# Patient Record
Sex: Male | Born: 1970 | Race: White | Hispanic: No | Marital: Married | Smoking: Former smoker
Health system: Southern US, Community
[De-identification: ages and names within clinical notes are randomized; demographics above are authoritative.]

---

## 2003-11-14 ENCOUNTER — Emergency Department (HOSPITAL_COMMUNITY): Admission: EM | Admit: 2003-11-14 | Discharge: 2003-11-14 | Payer: Self-pay | Admitting: Emergency Medicine

## 2005-01-22 ENCOUNTER — Emergency Department: Payer: Self-pay | Admitting: Emergency Medicine

## 2005-02-23 ENCOUNTER — Emergency Department: Payer: Self-pay | Admitting: Emergency Medicine

## 2006-03-01 ENCOUNTER — Emergency Department: Payer: Self-pay | Admitting: Internal Medicine

## 2013-05-31 ENCOUNTER — Ambulatory Visit: Payer: Self-pay | Admitting: Family Medicine

## 2015-09-22 ENCOUNTER — Encounter: Payer: Self-pay | Admitting: *Deleted

## 2015-09-22 ENCOUNTER — Emergency Department
Admission: EM | Admit: 2015-09-22 | Discharge: 2015-09-22 | Disposition: A | Discharge status: Home / Self Care | Payer: Self-pay | Attending: Emergency Medicine | Admitting: Emergency Medicine

## 2015-09-22 DIAGNOSIS — Y999 Unspecified external cause status: Secondary | ICD-10-CM | POA: Insufficient documentation

## 2015-09-22 DIAGNOSIS — W268XXA Contact with other sharp object(s), not elsewhere classified, initial encounter: Secondary | ICD-10-CM | POA: Insufficient documentation

## 2015-09-22 DIAGNOSIS — S61214A Laceration without foreign body of right ring finger without damage to nail, initial encounter: Secondary | ICD-10-CM | POA: Insufficient documentation

## 2015-09-22 DIAGNOSIS — Z23 Encounter for immunization: Secondary | ICD-10-CM | POA: Insufficient documentation

## 2015-09-22 DIAGNOSIS — Y929 Unspecified place or not applicable: Secondary | ICD-10-CM | POA: Insufficient documentation

## 2015-09-22 DIAGNOSIS — Y9389 Activity, other specified: Secondary | ICD-10-CM | POA: Insufficient documentation

## 2015-09-22 MED ORDER — CEPHALEXIN 500 MG PO CAPS
500.0000 mg | ORAL_CAPSULE | Freq: Three times a day (TID) | ORAL | 0 refills | Status: DC
Start: 2015-09-22 — End: 2021-09-26

## 2015-09-22 MED ORDER — TETANUS-DIPHTH-ACELL PERTUSSIS 5-2.5-18.5 LF-MCG/0.5 IM SUSP
0.5000 mL | Freq: Once | INTRAMUSCULAR | Status: AC
  Administered 2015-09-22: 0.5 mL via INTRAMUSCULAR
  Filled 2015-09-22: qty 0.5

## 2015-09-22 MED ORDER — ACETAMINOPHEN 325 MG PO TABS
650.0000 mg | ORAL_TABLET | Freq: Once | ORAL | Status: AC
  Administered 2015-09-22: 650 mg via ORAL
  Filled 2015-09-22: qty 2

## 2015-09-22 MED ORDER — LIDOCAINE HCL (PF) 1 % IJ SOLN
5.0000 mL | Freq: Once | INTRAMUSCULAR | Status: AC
  Administered 2015-09-22: 5 mL
  Filled 2015-09-22: qty 5

## 2015-09-22 MED ORDER — HYDROCODONE-ACETAMINOPHEN 5-325 MG PO TABS: 1.0000 | ORAL_TABLET | Freq: Four times a day (QID) | ORAL | 0 refills | Status: DC | PRN

## 2015-09-22 NOTE — ED Provider Notes (Signed)
[facility] Emergency Department Provider Note  ____________________________________________  Time seen: Approximately 2:46 PM  I have reviewed the triage vital signs and the nursing notes.   HISTORY  Chief Complaint Laceration    HPI Bob Shaw is a 45 y.o. male , NAD, presents to the emergency department with laceration to the right ring finger. Patient states he was sitting in a stool at work and lowered the seat and something below the seat cut his right ring finger. He has applied pressure as well as ice to the area to control the bleeding. Denies any numbness, weakness, tingling to the right hand or fingers.No pain about the right wrist, hand, fingers except at laceration site. Is not on any blood thinners. He is uncertain of when his tetanus was last updated.   History reviewed. No pertinent past medical history.  There are no active problems to display for this patient.   History reviewed. No pertinent surgical history.  Prior to Admission medications   Medication Sig Start Date End Date Taking? Authorizing Provider  cephALEXin (KEFLEX) 500 MG capsule Take 1 capsule (500 mg total) by mouth 3 (three) times daily. 09/22/15   Emmy Keng L Markela Wee, PA-C  HYDROcodone-acetaminophen (NORCO) 5-325 MG tablet Take 1 tablet by mouth every 6 (six) hours as needed for severe pain. 09/22/15   Arisbeth Purrington L Jeremih Dearmas, PA-C    Allergies Sulfa antibiotics  No family history on file.  Social History Social History  Substance Use Topics  . Smoking status: Never Smoker  . Smokeless tobacco: Never Used  . Alcohol use No     Review of Systems  Constitutional: No fever/chills Cardiovascular: No chest pain. Respiratory:  No shortness of breath.  Musculoskeletal: Negative for right wrist, hand, finger pain (except at laceration site).  Skin: Laceration right ring finger with bleeding controlled. Negative for rash, redness, swelling. Neurological: Negative for numbness,  weakness, tingling.  10-point ROS otherwise negative.  ____________________________________________   PHYSICAL EXAM:  VITAL SIGNS: ED Triage Vitals [09/22/15 1431]  Enc Vitals Group     BP 130/87     Pulse Rate 70     Resp 20     Temp 97.7 F (36.5 C)     Temp Source Oral     SpO2 98 %     Weight 210 lb (95.3 kg)     Height 6\' 5"  (1.956 m)     Head Circumference      Peak Flow      Pain Score      Pain Loc      Pain Edu?      Excl. in GC?      Constitutional: Alert and oriented. Well appearing and in no acute distress. Eyes: Conjunctivae are normal without icterus or injection.  Head: Atraumatic. Cardiovascular:  Good peripheral circulation with 2+ pulses noted in the right upper extremity. Capillary refill is brisk in the digits of right hand. Respiratory: Normal respiratory effort without tachypnea or retractions.  Musculoskeletal: No lower extremity tenderness nor edema.  No joint effusions. Neurologic:  Normal speech and language. No gross focal neurologic deficits are appreciated. Sensation to light touch grossly intact, right ring finger. Skin:  1.2cm laceration to the tip of the right ring finger with bleeding controlled. No damage to the nail or nailbed. Skin is warm, dry and intact. No rash noted. Psychiatric: Mood and affect are normal. Speech and behavior are normal. Patient exhibits appropriate insight and judgement.   ____________________________________________   Vickie EpleyLABS  None ____________________________________________  EKG  None ____________________________________________  RADIOLOGY  None ____________________________________________    PROCEDURES  Procedure(s) performed: None   .Marland KitchenLaceration Repair Date/Time: 09/22/2015 2:57 PM Performed by: Hope Pigeon Authorized by: Hope Pigeon   Consent:    Consent obtained:  Verbal   Consent given by:  Patient   Risks discussed:  Infection, pain and poor cosmetic result   Alternatives  discussed:  No treatment Anesthesia (see MAR for exact dosages):    Anesthesia method:  Nerve block   Block location:  Right ring finger   Block needle gauge:  25 G   Block anesthetic:  Lidocaine 1% w/o epi   Block technique:  Digital   Block injection procedure:  Anatomic landmarks identified, anatomic landmarks palpated, introduced needle, negative aspiration for blood and incremental injection   Block outcome:  Anesthesia achieved Laceration details:    Location:  Finger   Finger location:  R ring finger   Length (cm):  1.2   Depth (mm):  2 Repair type:    Repair type:  Simple Pre-procedure details:    Preparation:  Patient was prepped and draped in usual sterile fashion Exploration:    Hemostasis achieved with:  Direct pressure   Wound exploration: wound explored through full range of motion and entire depth of wound probed and visualized     Wound extent: no foreign bodies/material noted, no muscle damage noted, no nerve damage noted and no vascular damage noted     Contaminated: no   Treatment:    Area cleansed with:  Betadine   Amount of cleaning:  Standard   Irrigation solution:  Sterile saline   Irrigation method:  Syringe   Foreign body removal: No foreign bodies.   Skin repair:    Repair method:  Sutures   Suture size:  4-0   Suture material:  Nylon   Suture technique:  Simple interrupted   Number of sutures:  3 Approximation:    Approximation:  Close   Vermilion border: well-aligned   Post-procedure details:    Dressing:  Non-adherent dressing   Patient tolerance of procedure:  Tolerated well, no immediate complications      Medications  acetaminophen (TYLENOL) tablet 650 mg (650 mg Oral Given 09/22/15 1513)  Tdap (BOOSTRIX) injection 0.5 mL (0.5 mLs Intramuscular Given 09/22/15 1513)  lidocaine (PF) (XYLOCAINE) 1 % injection 5 mL (5 mLs Infiltration Given 09/22/15 1515)     ____________________________________________   INITIAL IMPRESSION / ASSESSMENT  AND PLAN / ED COURSE  Pertinent labs & imaging results that were available during my care of the patient were reviewed by me and considered in my medical decision making (see chart for details).  Clinical Course    Patient's diagnosis is consistent with laceration of right ring without foreign body without damage to nail. Patient advised to keep wound clean and dry over the next 48 hours and limit strong gripping with the right hand. Patient will be discharged home with prescriptions for Keflex and Norco to take as directed. Patient is to follow up with [facility] in one week for suture removal. Patient is given ED precautions to return to the ED for any worsening or new symptoms.    ____________________________________________  FINAL CLINICAL IMPRESSION(S) / ED DIAGNOSES  Final diagnoses:  Laceration of right ring finger w/o foreign body w/o damage to nail, initial encounter      NEW MEDICATIONS STARTED DURING THIS VISIT:  New Prescriptions   CEPHALEXIN (KEFLEX) 500 MG CAPSULE  Take 1 capsule (500 mg total) by mouth 3 (three) times daily.   HYDROCODONE-ACETAMINOPHEN (NORCO) 5-325 MG TABLET    Take 1 tablet by mouth every 6 (six) hours as needed for severe pain.         Hope Pigeon, PA-C 09/22/15 1552    Minna Antis, MD 09/23/15 [phone]

## 2015-09-22 NOTE — ED Notes (Signed)
Dressing placed on right 4th finger: nonadherent gauze with stretch net to hold in place.

## 2015-09-22 NOTE — ED Triage Notes (Signed)
Pt cut right finger tip on a stool,bleeding controlled
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
## 2016-12-27 ENCOUNTER — Emergency Department: Payer: No Typology Code available for payment source

## 2016-12-27 ENCOUNTER — Other Ambulatory Visit: Payer: Self-pay

## 2016-12-27 ENCOUNTER — Emergency Department
Admission: EM | Admit: 2016-12-27 | Discharge: 2016-12-27 | Disposition: A | Discharge status: Home / Self Care | Payer: No Typology Code available for payment source | Attending: Emergency Medicine | Admitting: Emergency Medicine

## 2016-12-27 ENCOUNTER — Encounter: Payer: Self-pay | Admitting: Emergency Medicine

## 2016-12-27 DIAGNOSIS — M25562 Pain in left knee: Secondary | ICD-10-CM | POA: Insufficient documentation

## 2016-12-27 DIAGNOSIS — S199XXA Unspecified injury of neck, initial encounter: Secondary | ICD-10-CM | POA: Diagnosis present

## 2016-12-27 DIAGNOSIS — Z87891 Personal history of nicotine dependence: Secondary | ICD-10-CM | POA: Insufficient documentation

## 2016-12-27 DIAGNOSIS — S161XXA Strain of muscle, fascia and tendon at neck level, initial encounter: Secondary | ICD-10-CM | POA: Insufficient documentation

## 2016-12-27 DIAGNOSIS — Z79899 Other long term (current) drug therapy: Secondary | ICD-10-CM | POA: Insufficient documentation

## 2016-12-27 DIAGNOSIS — M7918 Myalgia, other site: Secondary | ICD-10-CM | POA: Insufficient documentation

## 2016-12-27 DIAGNOSIS — Y999 Unspecified external cause status: Secondary | ICD-10-CM | POA: Insufficient documentation

## 2016-12-27 DIAGNOSIS — S39012A Strain of muscle, fascia and tendon of lower back, initial encounter: Secondary | ICD-10-CM

## 2016-12-27 DIAGNOSIS — Y9389 Activity, other specified: Secondary | ICD-10-CM | POA: Insufficient documentation

## 2016-12-27 DIAGNOSIS — Y929 Unspecified place or not applicable: Secondary | ICD-10-CM | POA: Insufficient documentation

## 2016-12-27 MED ORDER — CYCLOBENZAPRINE HCL 10 MG PO TABS
10.0000 mg | ORAL_TABLET | Freq: Once | ORAL | Status: AC
  Administered 2016-12-27: 10 mg via ORAL
  Filled 2016-12-27: qty 1

## 2016-12-27 MED ORDER — IBUPROFEN 600 MG PO TABS: 600.0000 mg | ORAL_TABLET | Freq: Three times a day (TID) | ORAL | 0 refills | Status: DC | PRN

## 2016-12-27 MED ORDER — TRAMADOL HCL 50 MG PO TABS
50.0000 mg | ORAL_TABLET | Freq: Four times a day (QID) | ORAL | 0 refills | Status: AC | PRN
Start: 2016-12-27 — End: 2017-12-27

## 2016-12-27 MED ORDER — CYCLOBENZAPRINE HCL 10 MG PO TABS: 10.0000 mg | ORAL_TABLET | Freq: Three times a day (TID) | ORAL | 0 refills | Status: DC | PRN

## 2016-12-27 MED ORDER — TRAMADOL HCL 50 MG PO TABS
50.0000 mg | ORAL_TABLET | Freq: Once | ORAL | Status: AC
  Administered 2016-12-27: 50 mg via ORAL
  Filled 2016-12-27: qty 1

## 2016-12-27 MED ORDER — IBUPROFEN 800 MG PO TABS
800.0000 mg | ORAL_TABLET | Freq: Once | ORAL | Status: AC
  Administered 2016-12-27: 800 mg via ORAL
  Filled 2016-12-27: qty 1
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
## 2021-09-26 ENCOUNTER — Other Ambulatory Visit: Payer: Self-pay

## 2021-09-26 ENCOUNTER — Emergency Department: Payer: Self-pay

## 2021-09-26 ENCOUNTER — Emergency Department
Admission: EM | Admit: 2021-09-26 | Discharge: 2021-09-26 | Disposition: A | Discharge status: Home / Self Care | Payer: Self-pay | Attending: Emergency Medicine | Admitting: Emergency Medicine

## 2021-09-26 DIAGNOSIS — K5900 Constipation, unspecified: Secondary | ICD-10-CM | POA: Insufficient documentation

## 2021-09-26 DIAGNOSIS — N132 Hydronephrosis with renal and ureteral calculous obstruction: Secondary | ICD-10-CM | POA: Insufficient documentation

## 2021-09-26 DIAGNOSIS — J069 Acute upper respiratory infection, unspecified: Secondary | ICD-10-CM | POA: Insufficient documentation

## 2021-09-26 DIAGNOSIS — N200 Calculus of kidney: Secondary | ICD-10-CM

## 2021-09-26 LAB — URINALYSIS, ROUTINE W REFLEX MICROSCOPIC
Bilirubin Urine: NEGATIVE
Glucose, UA: NEGATIVE mg/dL
Ketones, ur: NEGATIVE mg/dL
Leukocytes,Ua: NEGATIVE
Nitrite: NEGATIVE
Protein, ur: 30 mg/dL — AB
RBC / HPF: >50 RBC/hpf — ABNORMAL HIGH (ref 0–5)
Specific Gravity, Urine: 1.032 — ABNORMAL HIGH (ref 1.005–1.030)
pH: 7 (ref 5.0–8.0)

## 2021-09-26 LAB — COMPREHENSIVE METABOLIC PANEL
ALT: 33 U/L (ref 0–44)
AST: 31 U/L (ref 15–41)
Albumin: 4.7 g/dL (ref 3.5–5.0)
Alkaline Phosphatase: 77 U/L (ref 38–126)
Anion gap: 10 (ref 5–15)
BUN: 19 mg/dL (ref 6–20)
CO2: 22 mmol/L (ref 22–32)
Calcium: 10.2 mg/dL (ref 8.9–10.3)
Chloride: 108 mmol/L (ref 98–111)
Creatinine, Ser: 1.11 mg/dL (ref 0.61–1.24)
GFR, Estimated: >60 mL/min (ref >60)
Glucose, Bld: 112 mg/dL — ABNORMAL HIGH (ref 70–99)
Potassium: 3.9 mmol/L (ref 3.5–5.1)
Sodium: 140 mmol/L (ref 135–145)
Total Bilirubin: 1.4 mg/dL — ABNORMAL HIGH (ref 0.3–1.2)
Total Protein: 7.7 g/dL (ref 6.5–8.1)

## 2021-09-26 LAB — CBC
HCT: 49.1 % (ref 39.0–52.0)
Hemoglobin: 16.8 g/dL (ref 13.0–17.0)
MCH: 29.7 pg (ref 26.0–34.0)
MCHC: 34.2 g/dL (ref 30.0–36.0)
MCV: 86.9 fL (ref 80.0–100.0)
Platelets: 254 10*3/uL (ref 150–400)
RBC: 5.65 MIL/uL (ref 4.22–5.81)
RDW: 11.9 % (ref 11.5–15.5)
WBC: 12 10*3/uL — ABNORMAL HIGH (ref 4.0–10.5)
nRBC: 0 % (ref 0.0–0.2)

## 2021-09-26 LAB — LIPASE, BLOOD: Lipase: 41 U/L (ref 11–51)

## 2021-09-26 MED ORDER — ONDANSETRON HCL 4 MG/2ML IJ SOLN
4.0000 mg | Freq: Once | INTRAMUSCULAR | Status: AC
  Administered 2021-09-26: 4 mg via INTRAVENOUS
  Filled 2021-09-26: qty 2

## 2021-09-26 MED ORDER — IOHEXOL 300 MG/ML  SOLN
100.0000 mL | Freq: Once | INTRAMUSCULAR | Status: AC | PRN
  Administered 2021-09-26: 100 mL via INTRAVENOUS

## 2021-09-26 MED ORDER — SODIUM CHLORIDE 0.9 % IV BOLUS
1000.0000 mL | Freq: Once | INTRAVENOUS | Status: AC
  Administered 2021-09-26: 1000 mL via INTRAVENOUS

## 2021-09-26 MED ORDER — ONDANSETRON 4 MG PO TBDP: 4.0000 mg | ORAL_TABLET | Freq: Three times a day (TID) | ORAL | 0 refills | Status: AC | PRN

## 2021-09-26 MED ORDER — CEPHALEXIN 500 MG PO CAPS: 500.0000 mg | ORAL_CAPSULE | Freq: Three times a day (TID) | ORAL | 0 refills | Status: AC

## 2021-09-26 MED ORDER — OXYCODONE-ACETAMINOPHEN 5-325 MG PO TABS: 1.0000 | ORAL_TABLET | ORAL | 0 refills | Status: DC | PRN

## 2021-09-26 MED ORDER — KETOROLAC TROMETHAMINE 30 MG/ML IJ SOLN
30.0000 mg | Freq: Once | INTRAMUSCULAR | Status: AC
Start: 2021-09-26 — End: 2021-09-26
  Administered 2021-09-26: 30 mg via INTRAVENOUS
  Filled 2021-09-26: qty 1

## 2021-09-26 MED ORDER — OXYCODONE-ACETAMINOPHEN 5-325 MG PO TABS: 1.0000 | ORAL_TABLET | ORAL | 0 refills | Status: AC | PRN

## 2021-09-26 MED ORDER — SODIUM CHLORIDE 0.9 % IV SOLN
1.0000 g | Freq: Once | INTRAVENOUS | Status: AC
  Administered 2021-09-26: 1 g via INTRAVENOUS
  Filled 2021-09-26: qty 10

## 2021-09-26 NOTE — ED Provider Notes (Signed)
[facility] Provider Note    Event Date/Time   First MD Initiated Contact with Patient 09/26/21 1835     (approximate)  History   Chief Complaint: Abdominal Pain  HPI  Bob Shaw is a 51 y.o. male with a past medical history of a kidney stone presents to the emergency department for cute onset of right flank pain and lower abdominal pain.  Patient states around 2 PM today he developed acute onset of lower abdominal pain and right flank pain along with nausea and vomiting.  Patient states he does not believe he has urinated since.  States some mild constipation.  No fever.  Physical Exam   Triage Vital Signs: ED Triage Vitals  Enc Vitals Group     BP 09/26/21 1749 (!) 130/93     Pulse Rate 09/26/21 1749 81     Resp 09/26/21 1749 16     Temp 09/26/21 1749 98.2 F (36.8 C)     Temp Source 09/26/21 1749 Oral     SpO2 09/26/21 1749 96 %     Weight 09/26/21 1822 210 lb 1.6 oz (95.3 kg)     Height 09/26/21 1822 6\' 5"  (1.956 m)     Head Circumference --      Peak Flow --      Pain Score 09/26/21 1715 10     Pain Loc --      Pain Edu? --      Excl. in GC? --     Most recent vital signs: Vitals:   09/26/21 1749  BP: (!) 130/93  Pulse: 81  Resp: 16  Temp: 98.2 F (36.8 C)  SpO2: 96%    General: Awake, mild distress due to pain. CV:  Good peripheral perfusion.  Regular rate and rhythm  Resp:  Normal effort.  Equal breath sounds bilaterally.  Abd:  No distention.  Soft, nontender.  No rebound or guarding.    ED Results / Procedures / Treatments   RADIOLOGY  I have reviewed the CT images my interpretation patient appears to have a small kidney stone at the distal right ureter. Radiology is read the CT scan as a 2 mm calculus in the right posterior aspect of the bladder with prominence of the right renal pelvis.  Positive for perinephric stranding.  MEDICATIONS ORDERED IN ED: Medications  ondansetron (ZOFRAN) injection 4 mg (has no  administration in time range)  sodium chloride 0.9 % bolus 1,000 mL (has no administration in time range)  ketorolac (TORADOL) 30 MG/ML injection 30 mg (has no administration in time range)  iohexol (OMNIPAQUE) 300 MG/ML solution 100 mL (100 mLs Intravenous Contrast Given 09/26/21 1838)     IMPRESSION / MDM / ASSESSMENT AND PLAN / ED COURSE  I reviewed the triage vital signs and the nursing notes.  Patient's presentation is most consistent with acute presentation with potential threat to life or bodily function.  Patient presents emergency department for cute onset of right flank pain lower abdominal pain nausea vomiting starting around 2 PM today.  Patient symptoms are very suggestive of ureterolithiasis.  We will check labs, urine and obtain a CT renal scan.  We will dose Toradol Zofran IV fluids and reassess.  Patient appears to be moderately uncomfortable during my exam.  Patient's lab work has resulted showing white blood cell count of 12, chemistry reassuring, urinalysis does show greater than 50 red cells consistent with ureterolithiasis but also shows many bacteria but no white blood cells.  We will send a urine culture we will cover the patient with antibiotics.  CT appears to show a recently passed stone or stone at the very tip of the UVJ.  We will discharge with Flomax, increase fluids.  We will have the patient follow-up with urology he will call tomorrow to obtain an appointment.  Patient agreeable to plan of care.  FINAL CLINICAL IMPRESSION(S) / ED DIAGNOSES   Ureterolithiasis UTI   Note:  This document was prepared using Dragon voice recognition software and may include unintentional dictation errors.   Minna Antis, MD 09/26/21 [phone]

## 2021-09-26 NOTE — ED Provider Triage Note (Signed)
Emergency Medicine Provider Triage Evaluation Note  ZYAIR RUSSI , a 51 y.o. male  was evaluated in triage.  Pt complains of lower abdominal pain radiating into back. Mild constipation reported. No urinary changes. Positive vomiting..  Review of Systems  Positive: Abdominal pain, Nausea, constipation Negative: Fever, chills, hematuria  Physical Exam  BP (!) 130/93   Pulse 81   Temp 98.2 F (36.8 C) (Oral)   Resp 16   SpO2 96%  Gen:   Awake, no distress   Resp:  Normal effort  MSK:   Moves extremities without difficulty  Other:  Tender diffusely lower abd  Medical Decision Making  Medically screening exam initiated at 5:58 PM.  Appropriate orders placed.  Kacyn Souder Skilling was informed that the remainder of the evaluation will be completed by another provider, this initial triage assessment does not replace that evaluation, and the importance of remaining in the ED until their evaluation is complete.  Patient will have labs, CT, urinalysis   Alm Bustard Delorise Royals, PA-C 09/26/21 1758
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
## 2021-09-26 NOTE — Discharge Instructions (Addendum)
Please call the number provided for urology to arrange a follow-up appointment soon as possible.  Please return to the emergency department immediately for any fever.  Please take your medications as prescribed.

## 2021-09-28 LAB — URINE CULTURE: Culture: NO GROWTH

## 2022-06-16 ENCOUNTER — Ambulatory Visit: Payer: Self-pay | Admitting: Nurse Practitioner

## 2023-08-26 LAB — COLOGUARD: COLOGUARD: NEGATIVE

## 2023-12-23 ENCOUNTER — Other Ambulatory Visit: Payer: Self-pay

## 2023-12-23 ENCOUNTER — Emergency Department
Admission: EM | Admit: 2023-12-23 | Discharge: 2023-12-24 | Disposition: A | Discharge status: Home / Self Care | Payer: Self-pay | Attending: Emergency Medicine | Admitting: Emergency Medicine

## 2023-12-23 ENCOUNTER — Emergency Department: Payer: Self-pay

## 2023-12-23 DIAGNOSIS — R079 Chest pain, unspecified: Secondary | ICD-10-CM

## 2023-12-23 DIAGNOSIS — R0789 Other chest pain: Secondary | ICD-10-CM | POA: Insufficient documentation

## 2023-12-23 LAB — CBC
HCT: 44.9 % (ref 39.0–52.0)
Hemoglobin: 15.1 g/dL (ref 13.0–17.0)
MCH: 30.1 pg (ref 26.0–34.0)
MCHC: 33.6 g/dL (ref 30.0–36.0)
MCV: 89.4 fL (ref 80.0–100.0)
Platelets: 220 K/uL (ref 150–400)
RBC: 5.02 MIL/uL (ref 4.22–5.81)
RDW: 11.8 % (ref 11.5–15.5)
WBC: 6.8 K/uL (ref 4.0–10.5)
nRBC: 0 % (ref 0.0–0.2)

## 2023-12-23 LAB — TROPONIN T, HIGH SENSITIVITY: Troponin T High Sensitivity: <15 ng/L (ref 0–19)

## 2023-12-23 LAB — BASIC METABOLIC PANEL WITH GFR
Anion gap: 11 (ref 5–15)
BUN: 18 mg/dL (ref 6–20)
CO2: 24 mmol/L (ref 22–32)
Calcium: 9.5 mg/dL (ref 8.9–10.3)
Chloride: 107 mmol/L (ref 98–111)
Creatinine, Ser: 0.94 mg/dL (ref 0.61–1.24)
GFR, Estimated: >60 mL/min (ref >60)
Glucose, Bld: 122 mg/dL — ABNORMAL HIGH (ref 70–99)
Potassium: 3.9 mmol/L (ref 3.5–5.1)
Sodium: 142 mmol/L (ref 135–145)

## 2023-12-23 NOTE — ED Triage Notes (Signed)
 Pt reports chest tightness shortness of breath and feeling anxious that all began aprox 1 hour pta. Pt denies known cardiac hx, denies cough or congestion.

## 2023-12-23 NOTE — ED Provider Notes (Signed)
 [facility] Provider Note    Event Date/Time   First MD Initiated Contact with Patient 12/23/23 2340     (approximate)   History   Chest Pain   HPI  Bob Shaw is a 53 year old male presenting to the ER for evaluation of chest pain.  Patient reports that about an hour prior to presentation he was in an argument with his wife when he began to feel anxious with chest tightness and shortness of breath.  Episode lasted for about 30 minutes to an hour.  Denies known cardiac history.  Does report he has had similar episodes in the past that he attributes to stress.  Currently reports symptoms completely resolved.     Physical Exam   Triage Vital Signs: ED Triage Vitals  Encounter Vitals Group     BP 12/23/23 2150 115/79     Girls Systolic BP Percentile --      Girls Diastolic BP Percentile --      Boys Systolic BP Percentile --      Boys Diastolic BP Percentile --      Pulse Rate 12/23/23 2150 91     Resp 12/23/23 2150 20     Temp 12/23/23 2150 98.4 F (36.9 C)     Temp Source 12/23/23 2150 Oral     SpO2 12/23/23 2150 96 %     Weight 12/23/23 2148 212 lb (96.2 kg)     Height 12/23/23 2148 6' 4 (1.93 m)     Head Circumference --      Peak Flow --      Pain Score 12/23/23 2148 3     Pain Loc --      Pain Education --      Exclude from Growth Chart --     Most recent vital signs: Vitals:   12/23/23 2333 12/23/23 2335  BP:  116/87  Pulse:  77  Resp:  19  Temp:    SpO2: 96% 100%     General: Awake, interactive  CV:  Good peripheral perfusion Resp:  Unlabored respirations, lungs clear to auscultation Abd:  Nondistended.  Neuro:  Symmetric facial movement, fluid speech   ED Results / Procedures / Treatments   Labs (all labs ordered are listed, but only abnormal results are displayed) Labs Reviewed  BASIC METABOLIC PANEL WITH GFR - Abnormal; Notable for the following components:      Result Value   Glucose, Bld 122 (*)    All  other components within normal limits  CBC  TROPONIN T, HIGH SENSITIVITY  TROPONIN T, HIGH SENSITIVITY     EKG EKG independently reviewed and interpreted by myself demonstrates:  EKG demonstrates normal sinus rhythm at a rate of 92, PR 156, QRS 94, QTc 430, no acute ST changes  RADIOLOGY Imaging independently reviewed and interpreted by myself demonstrates:  CXR without focal consolidation  Formal Radiology Read:  DG Chest 2 View Result Date: 12/23/2023 EXAM: 2 VIEW(S) XRAY OF THE CHEST 12/23/2023 10:13:00 PM COMPARISON: 03/01/2006 CLINICAL HISTORY: cp FINDINGS: LUNGS AND PLEURA: No focal pulmonary opacity. No pleural effusion. No pneumothorax. HEART AND MEDIASTINUM: No acute abnormality of the cardiac and mediastinal silhouettes. BONES AND SOFT TISSUES: No acute osseous abnormality. IMPRESSION: 1. No acute cardiopulmonary process. Electronically signed by: [name] MD 12/23/2023 10:16 PM EST RP Workstation: [id number]    PROCEDURES:  Critical Care performed: No  Procedures   MEDICATIONS ORDERED IN ED: Medications - No data to display   IMPRESSION /  MDM / ASSESSMENT AND PLAN / ED COURSE  I reviewed the triage vital signs and the nursing notes.  Differential diagnosis includes, but is not limited to ACS, pneumonia, pneumothorax, low risk PE and PERC negative, musculoskeletal strain, stress-mediated physiologic response  Patient's presentation is most consistent with acute presentation with potential threat to life or bodily function.  Presents with chest pain. Labs, EKG, CXR reassuring. Negative troponin.  Will obtain repeat given onset shortly prior to presentation.   Repeat troponin returned within normal limits. Low risk HEART score. Low suspicion emergent process. Do think patient is stable for discharge with outpatient follow-up. Strict return precautions provided.  Patient discharged in stable condition.     FINAL CLINICAL IMPRESSION(S) / ED DIAGNOSES    Final diagnoses:  Nonspecific chest pain     Rx / DC Orders   ED Discharge Orders     None        Note:  This document was prepared using Dragon voice recognition software and may include unintentional dictation errors.   Levander Slate, MD 12/24/23 [phone]

## 2023-12-24 LAB — TROPONIN T, HIGH SENSITIVITY: Troponin T High Sensitivity: <15 ng/L (ref 0–19)

## 2023-12-24 NOTE — Discharge Instructions (Addendum)
You were seen in the Emergency Department today for evaluation of your chest pain. Fortunately, your labs, EKG, and chest x-Deshawnda Acrey were overall reassuring against a emergency cause for your pain. Please follow-up with your primary doctor within the next few days for reevaluation.Return to the ER for any new or worsening symptoms including worsening chest pain, difficulty breathing, or any other new or concerning symptoms that you believe warrants immediate attention.
